# Patient Record
Sex: Male | Born: 1980 | Race: White | Hispanic: No | Marital: Married | Smoking: Never smoker
Health system: Southern US, Community
[De-identification: ages and names within clinical notes are randomized; demographics above are authoritative.]

---

## 2000-04-16 ENCOUNTER — Observation Stay (HOSPITAL_COMMUNITY): Admission: EM | Admit: 2000-04-16 | Discharge: 2000-04-17 | Payer: Self-pay | Admitting: Emergency Medicine

## 2000-04-18 ENCOUNTER — Inpatient Hospital Stay (HOSPITAL_COMMUNITY): Admission: EM | Admit: 2000-04-18 | Discharge: 2000-04-19 | Payer: Self-pay | Admitting: Emergency Medicine

## 2000-04-18 ENCOUNTER — Encounter: Payer: Self-pay | Admitting: Otolaryngology

## 2015-11-07 ENCOUNTER — Other Ambulatory Visit: Payer: Self-pay | Admitting: Family Medicine

## 2015-11-07 ENCOUNTER — Ambulatory Visit
Admission: RE | Admit: 2015-11-07 | Discharge: 2015-11-07 | Disposition: A | Discharge status: Home / Self Care | Payer: BLUE CROSS/BLUE SHIELD | Source: Ambulatory Visit | Attending: Family Medicine | Admitting: Family Medicine

## 2015-11-07 DIAGNOSIS — R509 Fever, unspecified: Secondary | ICD-10-CM

## 2015-11-07 DIAGNOSIS — R059 Cough, unspecified: Secondary | ICD-10-CM

## 2015-11-07 DIAGNOSIS — R05 Cough: Secondary | ICD-10-CM

## 2019-03-03 ENCOUNTER — Ambulatory Visit: Payer: BLUE CROSS/BLUE SHIELD

## 2019-03-11 ENCOUNTER — Ambulatory Visit: Payer: Self-pay

## 2019-03-20 ENCOUNTER — Ambulatory Visit: Payer: BLUE CROSS/BLUE SHIELD

## 2020-03-20 ENCOUNTER — Other Ambulatory Visit: Payer: Self-pay | Admitting: Family Medicine

## 2020-03-20 DIAGNOSIS — R519 Headache, unspecified: Secondary | ICD-10-CM

## 2020-04-05 ENCOUNTER — Ambulatory Visit: Payer: 59 | Admitting: Physical Therapy

## 2020-04-12 ENCOUNTER — Other Ambulatory Visit: Payer: Self-pay

## 2020-04-12 ENCOUNTER — Ambulatory Visit
Admission: RE | Admit: 2020-04-12 | Discharge: 2020-04-12 | Disposition: A | Discharge status: Home / Self Care | Payer: 59 | Source: Ambulatory Visit | Attending: Family Medicine | Admitting: Family Medicine

## 2020-04-12 DIAGNOSIS — R519 Headache, unspecified: Secondary | ICD-10-CM

## 2020-04-12 MED ORDER — GADOBENATE DIMEGLUMINE 529 MG/ML IV SOLN
20.0000 mL | Freq: Once | INTRAVENOUS | Status: AC | PRN
  Administered 2020-04-12: 20 mL via INTRAVENOUS

## 2020-04-25 ENCOUNTER — Ambulatory Visit: Payer: 59 | Admitting: Physical Therapy

## 2021-07-09 DIAGNOSIS — H109 Unspecified conjunctivitis: Secondary | ICD-10-CM | POA: Diagnosis not present

## 2021-07-09 DIAGNOSIS — J309 Allergic rhinitis, unspecified: Secondary | ICD-10-CM | POA: Diagnosis not present

## 2021-08-05 ENCOUNTER — Emergency Department (HOSPITAL_BASED_OUTPATIENT_CLINIC_OR_DEPARTMENT_OTHER)
Admission: EM | Admit: 2021-08-05 | Discharge: 2021-08-06 | Disposition: A | Discharge status: Home / Self Care | Payer: 59 | Attending: Emergency Medicine | Admitting: Emergency Medicine

## 2021-08-05 ENCOUNTER — Encounter (HOSPITAL_BASED_OUTPATIENT_CLINIC_OR_DEPARTMENT_OTHER): Payer: Self-pay

## 2021-08-05 DIAGNOSIS — W39XXXA Discharge of firework, initial encounter: Secondary | ICD-10-CM | POA: Diagnosis not present

## 2021-08-05 DIAGNOSIS — S0181XA Laceration without foreign body of other part of head, initial encounter: Secondary | ICD-10-CM

## 2021-08-05 DIAGNOSIS — S0993XA Unspecified injury of face, initial encounter: Secondary | ICD-10-CM | POA: Diagnosis not present

## 2021-08-05 DIAGNOSIS — Z23 Encounter for immunization: Secondary | ICD-10-CM | POA: Insufficient documentation

## 2021-08-05 MED ORDER — LIDOCAINE HCL (PF) 1 % IJ SOLN
5.0000 mL | Freq: Once | INTRAMUSCULAR | Status: AC
  Administered 2021-08-06: 5 mL via INTRADERMAL
  Filled 2021-08-05: qty 5

## 2021-08-05 MED ORDER — TETANUS-DIPHTH-ACELL PERTUSSIS 5-2.5-18.5 LF-MCG/0.5 IM SUSY
0.5000 mL | PREFILLED_SYRINGE | Freq: Once | INTRAMUSCULAR | Status: AC
  Administered 2021-08-06: 0.5 mL via INTRAMUSCULAR
  Filled 2021-08-05: qty 0.5

## 2021-08-05 NOTE — Discharge Instructions (Signed)
Local wound care with bacitracin and dressing changes twice daily.  Keep wound clean, dry, and covered as much as possible.  Bathing is okay, but avoid swimming in pools, lakes, or hot tubs.  Sutures are to be removed in 1 week.  Please follow-up with your primary doctor for this.  Return to the emergency department if you develop increased pain, pus draining from the wound, redness surrounding the wound, fevers, or for other new and concerning symptoms.
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
## 2021-08-06 NOTE — ED Notes (Signed)
Pt verbalizes understanding of discharge instructions. Opportunity for questioning and answers were provided. Pt discharged from ED to home with family.    

## 2021-08-18 DIAGNOSIS — R030 Elevated blood-pressure reading, without diagnosis of hypertension: Secondary | ICD-10-CM | POA: Diagnosis not present

## 2021-08-18 DIAGNOSIS — H7291 Unspecified perforation of tympanic membrane, right ear: Secondary | ICD-10-CM | POA: Diagnosis not present

## 2021-08-28 DIAGNOSIS — H903 Sensorineural hearing loss, bilateral: Secondary | ICD-10-CM | POA: Diagnosis not present

## 2021-08-28 DIAGNOSIS — H9311 Tinnitus, right ear: Secondary | ICD-10-CM | POA: Diagnosis not present

## 2021-08-28 DIAGNOSIS — S0921XA Traumatic rupture of right ear drum, initial encounter: Secondary | ICD-10-CM | POA: Diagnosis not present

## 2021-09-18 DIAGNOSIS — H7291 Unspecified perforation of tympanic membrane, right ear: Secondary | ICD-10-CM | POA: Diagnosis not present

## 2021-10-09 DIAGNOSIS — H9311 Tinnitus, right ear: Secondary | ICD-10-CM | POA: Diagnosis not present

## 2021-10-09 DIAGNOSIS — H903 Sensorineural hearing loss, bilateral: Secondary | ICD-10-CM | POA: Diagnosis not present

## 2021-10-09 DIAGNOSIS — S0921XD Traumatic rupture of right ear drum, subsequent encounter: Secondary | ICD-10-CM | POA: Diagnosis not present

## 2021-11-27 DIAGNOSIS — Z8669 Personal history of other diseases of the nervous system and sense organs: Secondary | ICD-10-CM | POA: Diagnosis not present

## 2021-11-27 DIAGNOSIS — H903 Sensorineural hearing loss, bilateral: Secondary | ICD-10-CM | POA: Diagnosis not present

## 2021-11-27 DIAGNOSIS — Z800 Family history of malignant neoplasm of digestive organs: Secondary | ICD-10-CM | POA: Diagnosis not present

## 2021-11-27 DIAGNOSIS — Z1211 Encounter for screening for malignant neoplasm of colon: Secondary | ICD-10-CM | POA: Diagnosis not present

## 2021-12-31 DIAGNOSIS — K648 Other hemorrhoids: Secondary | ICD-10-CM | POA: Diagnosis not present

## 2021-12-31 DIAGNOSIS — Z800 Family history of malignant neoplasm of digestive organs: Secondary | ICD-10-CM | POA: Diagnosis not present

## 2021-12-31 DIAGNOSIS — Z1211 Encounter for screening for malignant neoplasm of colon: Secondary | ICD-10-CM | POA: Diagnosis not present

## 2021-12-31 DIAGNOSIS — K5730 Diverticulosis of large intestine without perforation or abscess without bleeding: Secondary | ICD-10-CM | POA: Diagnosis not present

## 2022-04-06 DIAGNOSIS — Z0000 Encounter for general adult medical examination without abnormal findings: Secondary | ICD-10-CM | POA: Diagnosis not present

## 2022-04-21 DIAGNOSIS — R7401 Elevation of levels of liver transaminase levels: Secondary | ICD-10-CM | POA: Diagnosis not present

## 2022-09-30 IMAGING — MR MR HEAD WO/W CM
13 series · 48 of 48 positions shown · IV contrast (multihance)
Comparison: None.

CLINICAL DATA: Recurring headaches

EXAM:
MRI HEAD WITHOUT AND WITH CONTRAST
TECHNIQUE: Multiplanar, multiecho pulse sequences of the brain and surrounding
structures were obtained without and with intravenous contrast.
CONTRAST:  20mL MULTIHANCE GADOBENATE DIMEGLUMINE 529 MG/ML IV SOLN

[Series 5: T1 · sagittal · 4.0mm · 0.75mm/px · 2 of 31 slices shown (1 of 3)]
[im 1/31]
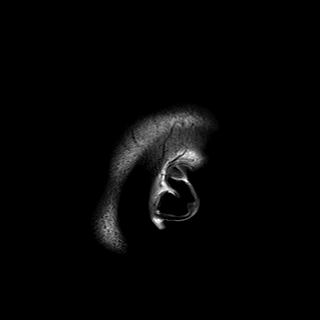
[im 31/31]
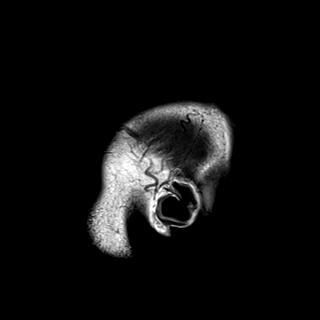

[Series 6: DWI · axial · 3.0mm · 0.94mm/px · z∈[-69,+71]mm · 8 of 160 slices shown (1 of 3)]
[im 1/160]
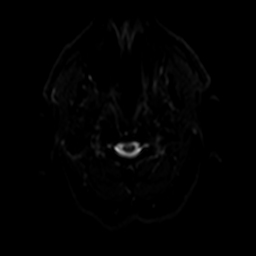
[im 23/160]
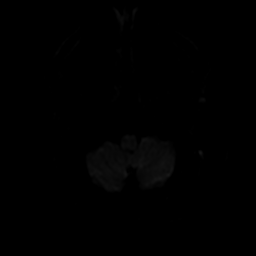
[im 46/160]
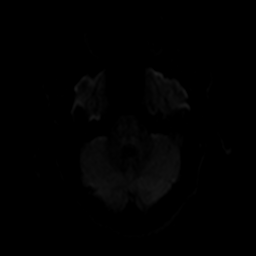
[im 69/160]
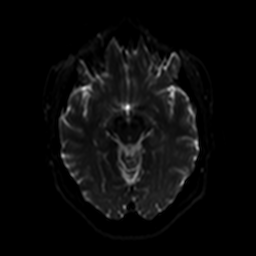
[im 91/160]
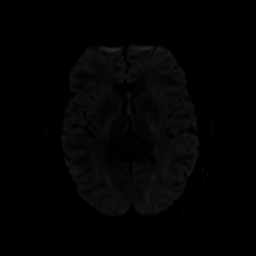
[im 114/160]
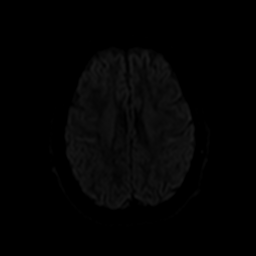
[im 137/160]
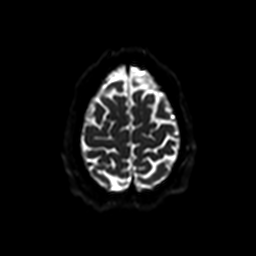
[im 160/160]
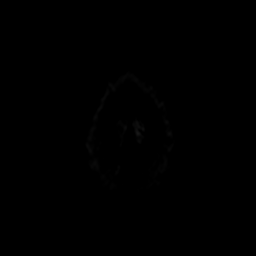

[Series 7: ax dwi_tracew · axial · 3.0mm · 0.94mm/px · z∈[-69,+71]mm · 4 of 80 slices shown]
[im 1/80]
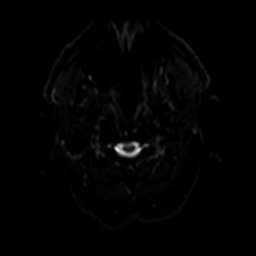
[im 27/80]
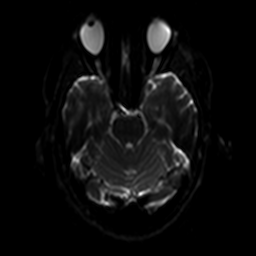
[im 53/80]
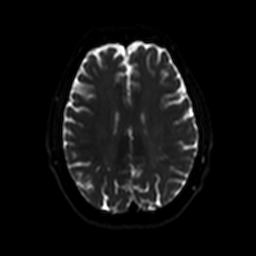
[im 80/80]
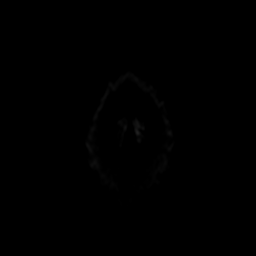

[Series 8: ax dwi_adc · axial · 3.0mm · 0.94mm/px · z∈[-69,+71]mm · 2 of 38 slices shown]
[im 1/38]
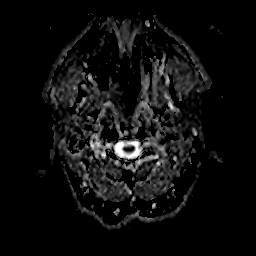
[im 38/38]
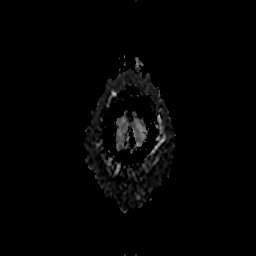

[Series 9: DWI · coronal · 5.0mm · 1.44mm/px · 3 of 60 slices shown (2 of 3)]
[im 1/60]
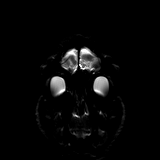
[im 30/60]
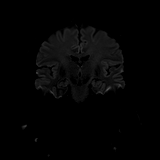
[im 60/60]
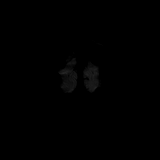

[Series 10: DWI · coronal · 5.0mm · 1.44mm/px · 2 of 30 slices shown (3 of 3)]
[im 1/30]
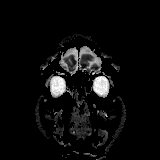
[im 30/30]
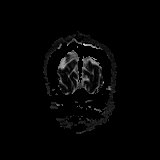

[Series 11: T2 · axial · 4.0mm · 0.36mm/px · 1 of 27 slices shown]
[im 1/27]
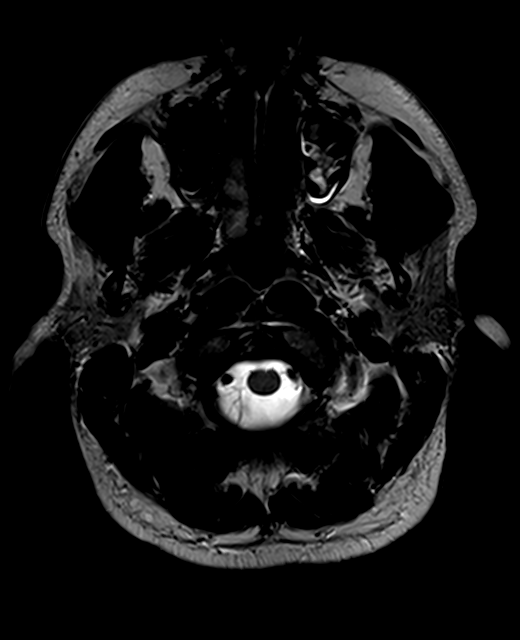

[Series 12: FLAIR · axial · 3.0mm · 0.72mm/px · 1 of 26 slices shown]
[im 1/26]
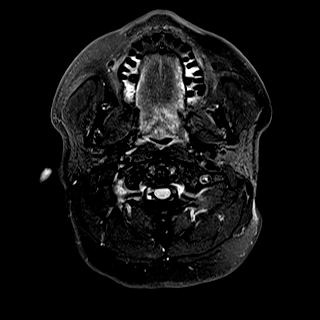

[Series 14: swi_images · axial · 1.5mm · 0.90mm/px · z∈[-70,+72]mm · 5 of 96 slices shown]
[im 1/96]
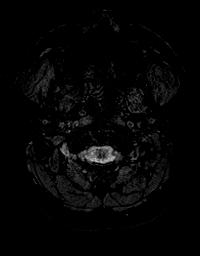
[im 24/96]
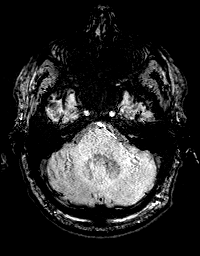
[im 48/96]
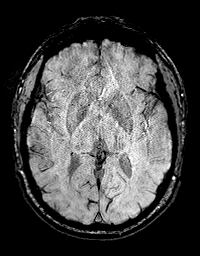
[im 72/96]
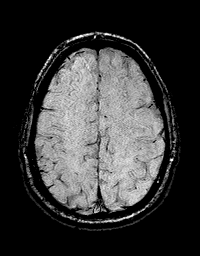
[im 96/96]
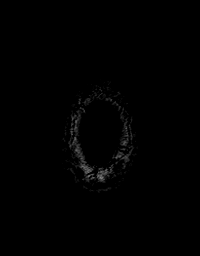

[Series 15: T1 · axial · 1.0mm · 0.94mm/px · z∈[-78,+81]mm · 8 of 160 slices shown (2 of 3)]
[im 1/160]
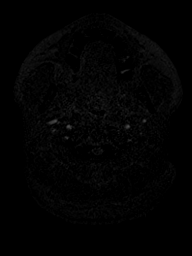
[im 23/160]
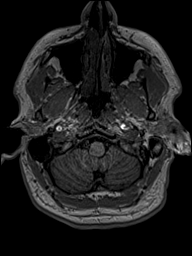
[im 46/160]
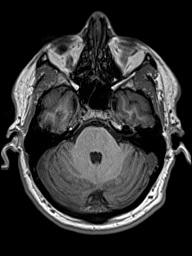
[im 69/160]
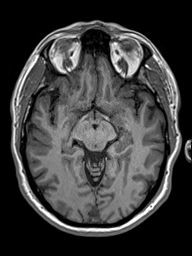
[im 91/160]
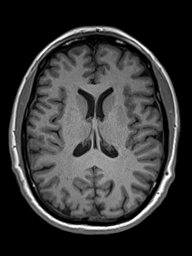
[im 114/160]
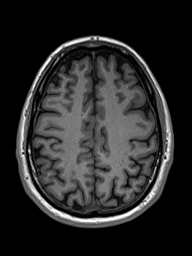
[im 137/160]
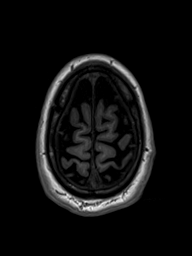
[im 160/160]
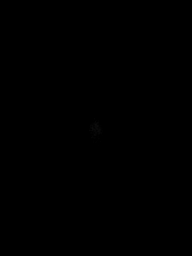

[Series 17: T1 · axial · 1.0mm · 0.94mm/px · z∈[-78,+81]mm · 8 of 160 slices shown (3 of 3)]
[im 1/160]
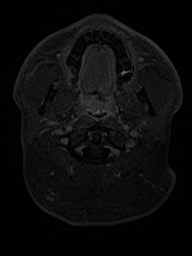
[im 23/160]
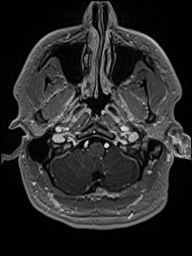
[im 46/160]
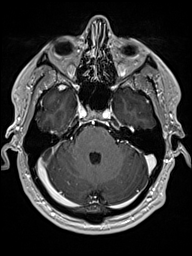
[im 69/160]
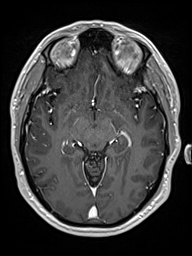
[im 91/160]
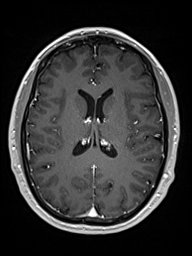
[im 114/160]
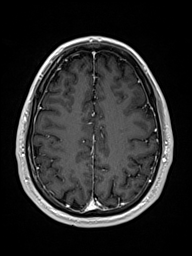
[im 137/160]
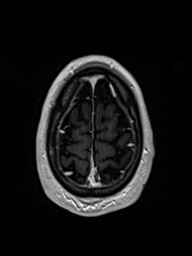
[im 160/160]
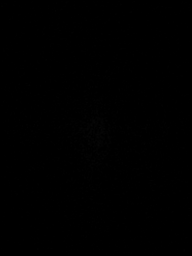

[Series 18: T1 post-contrast · coronal · 4.0mm · 0.72mm/px · 2 of 35 slices shown]
[im 1/35]
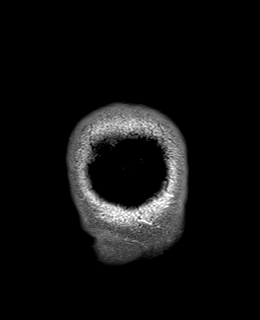
[im 35/35]
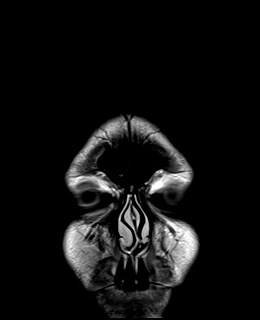

[Series 19: T2 post-contrast · coronal · 4.0mm · 0.36mm/px · 2 of 35 slices shown]
[im 1/35]
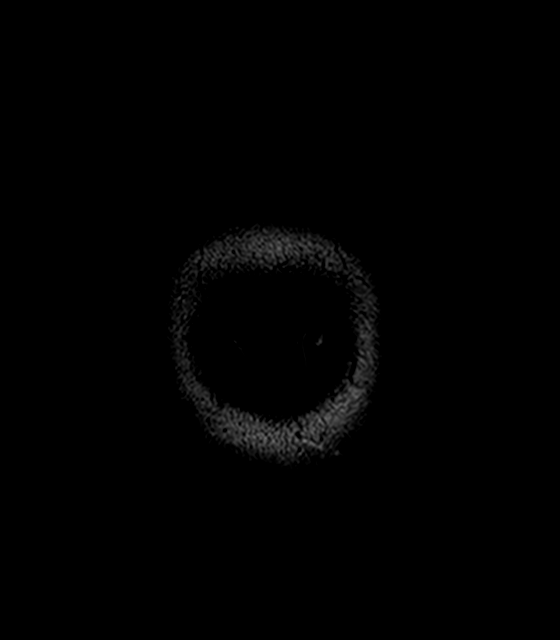
[im 35/35]
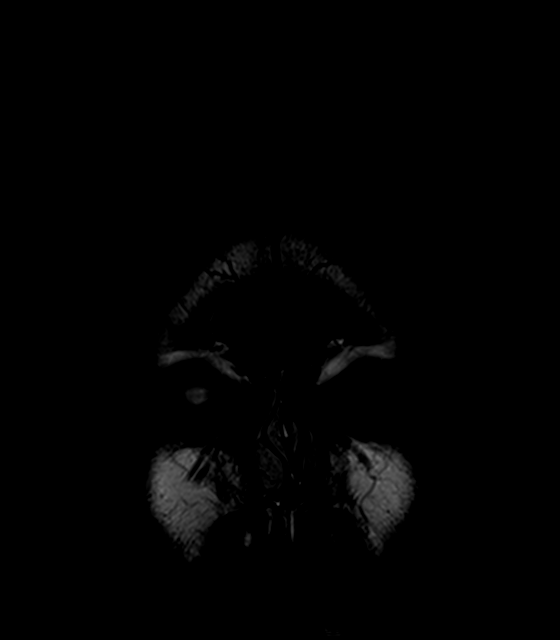

[48 of 48 positions shown; findings below may reference images not displayed]

FINDINGS: Brain: No acute infarction, hemorrhage, hydrocephalus, extra-axial
collection or mass lesion. No abnormal enhancement.

Vascular: Major arterial flow voids are maintained at the skull
base.

Skull and upper cervical spine: Normal marrow signal.

Sinuses/Orbits: Clear sinuses.  Unremarkable orbits.

Other: No sizable mastoid effusions.
IMPRESSION: No evidence of acute intracranial abnormality.

## 2022-11-09 DIAGNOSIS — R051 Acute cough: Secondary | ICD-10-CM | POA: Diagnosis not present

## 2022-11-09 DIAGNOSIS — R6883 Chills (without fever): Secondary | ICD-10-CM | POA: Diagnosis not present

## 2023-04-12 ENCOUNTER — Encounter: Payer: Self-pay | Admitting: Neurology

## 2023-08-30 ENCOUNTER — Ambulatory Visit: Admitting: Neurology
